# Patient Record
Sex: Female | Born: 1970 | Hispanic: No | Marital: Single
Health system: Southern US, Community
[De-identification: ages and names within clinical notes are randomized; demographics above are authoritative.]

---

## 2008-04-09 ENCOUNTER — Ambulatory Visit: Payer: Self-pay | Admitting: Family Medicine

## 2009-07-21 ENCOUNTER — Ambulatory Visit: Payer: Self-pay | Admitting: Physician Assistant

## 2009-09-20 ENCOUNTER — Ambulatory Visit: Payer: Self-pay | Admitting: Family Medicine

## 2011-11-29 ENCOUNTER — Other Ambulatory Visit: Payer: Self-pay | Admitting: Gastroenterology

## 2016-03-10 ENCOUNTER — Other Ambulatory Visit: Payer: Self-pay | Admitting: Family Medicine

## 2016-03-10 DIAGNOSIS — Z1231 Encounter for screening mammogram for malignant neoplasm of breast: Secondary | ICD-10-CM

## 2016-03-13 ENCOUNTER — Ambulatory Visit: Payer: Self-pay

## 2016-03-29 ENCOUNTER — Ambulatory Visit
Admission: RE | Admit: 2016-03-29 | Discharge: 2016-03-29 | Disposition: A | Discharge status: Home / Self Care | Payer: Commercial Managed Care - HMO | Source: Ambulatory Visit | Attending: Family Medicine | Admitting: Family Medicine

## 2016-03-29 DIAGNOSIS — Z1231 Encounter for screening mammogram for malignant neoplasm of breast: Secondary | ICD-10-CM

## 2016-05-02 ENCOUNTER — Encounter: Payer: Self-pay | Admitting: Sports Medicine

## 2016-05-02 ENCOUNTER — Ambulatory Visit (INDEPENDENT_AMBULATORY_CARE_PROVIDER_SITE_OTHER): Payer: Commercial Managed Care - HMO

## 2016-05-02 ENCOUNTER — Ambulatory Visit (INDEPENDENT_AMBULATORY_CARE_PROVIDER_SITE_OTHER): Payer: Commercial Managed Care - HMO | Admitting: Sports Medicine

## 2016-05-02 DIAGNOSIS — M79671 Pain in right foot: Secondary | ICD-10-CM | POA: Diagnosis not present

## 2016-05-02 DIAGNOSIS — M722 Plantar fascial fibromatosis: Secondary | ICD-10-CM

## 2016-05-02 MED ORDER — TRIAMCINOLONE ACETONIDE 10 MG/ML IJ SUSP: 10.0000 mg | Freq: Once | INTRAMUSCULAR | Status: AC

## 2016-05-02 NOTE — Patient Instructions (Signed)

## 2016-05-02 NOTE — Progress Notes (Signed)
Subjective: Vicki Reed is a 46 y.o. female patient presents to office with complaint of heel pain on the right. 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 Patient has treated this problem with frozen water bottle with no relief. Denies any other pedal complaints.   There are no active problems to display for this patient.   No current outpatient prescriptions on file prior to visit.   No current facility-administered medications on file prior to visit.     Not on File  Objective: Physical Exam General: The patient is alert and oriented x3 in no acute distress.  Dermatology: Skin is warm, dry and supple bilateral lower extremities. Nails 1-10 are normal. There is no erythema, edema, no eccymosis, no open lesions present. Integument is otherwise unremarkable.  Vascular: Dorsalis Pedis pulse and Posterior Tibial pulse are 2/4 bilateral. Capillary fill time is immediate to all digits.  Neurological: Grossly intact to light touch with an achilles reflex of +2/5 and a negative Tinel's sign bilateral.  Musculoskeletal: Tenderness to palpation at the medial calcaneal tubercale and through the insertion of the plantar fascia on the right foot. No pain with compression of calcaneus bilateral. No pain with tuning fork to calcaneus bilateral. No pain with calf compression bilateral. There is decreased Ankle joint range of motion bilateral. All other joints range of motion within normal limits bilateral. Strength 5/5 in all groups bilateral.   Gait: Unassisted, Antalgic avoid weight on left/right heel  Xray, Right foot:  Normal osseous mineralization. Joint spaces preserved except at midfoot with osteophytes. No fracture/dislocation/boney destruction. Calcaneal spur present with mild thickening of plantar fascia. No other soft tissue abnormalities or radiopaque foreign bodies.   Assessment and Plan: Problem List Items Addressed This Visit    None    Visit  Diagnoses    Right foot pain    -  Primary   Relevant Medications   triamcinolone acetonide (KENALOG) 10 MG/ML injection 10 mg (Start on 05/02/2016  9:00 AM)   Other Relevant Orders   DG Foot 2 Views Right   Plantar fasciitis, right       Relevant Medications   triamcinolone acetonide (KENALOG) 10 MG/ML injection 10 mg (Start on 05/02/2016  9:00 AM)     -Complete examination performed.  -Xrays reviewed -Discussed with patient in detail the condition of plantar fasciitis, how this occurs and general treatment options. Explained both conservative and surgical treatments.  -After oral consent and aseptic prep, injected a mixture containing 1 ml of 2% plain lidocaine, 1 ml 0.5% plain marcaine, 0.5 ml of kenalog 10 and 0.5 ml of dexamethasone phosphate into right heel. Post-injection care discussed with patient.  -Recommended good supportive shoes and advised use of OTC insert. Explained to patient that if these orthoses work well, we will continue with these. If these do not improve her condition and  pain, we will consider custom molded orthoses. - Explained in detail the use of the fascial brace for right foot which was dispensed at today's visit. -Explained and dispensed to patient daily stretching exercises. -Recommend patient to ice affected area 1-2x daily. -Patient to return to office in 4 weeks for follow up or sooner if problems or questions arise.  Asencion Islamitorya Sherod Cisse, DPM

## 2016-05-11 DIAGNOSIS — Z23 Encounter for immunization: Secondary | ICD-10-CM | POA: Diagnosis not present

## 2016-06-13 ENCOUNTER — Encounter: Payer: Self-pay | Admitting: Sports Medicine

## 2016-06-13 ENCOUNTER — Ambulatory Visit (INDEPENDENT_AMBULATORY_CARE_PROVIDER_SITE_OTHER): Payer: Commercial Managed Care - HMO | Admitting: Sports Medicine

## 2016-06-13 DIAGNOSIS — M79671 Pain in right foot: Secondary | ICD-10-CM | POA: Diagnosis not present

## 2016-06-13 DIAGNOSIS — M722 Plantar fascial fibromatosis: Secondary | ICD-10-CM

## 2016-06-13 MED ORDER — DICLOFENAC SODIUM 75 MG PO TBEC: 75.0000 mg | DELAYED_RELEASE_TABLET | Freq: Two times a day (BID) | ORAL | 0 refills | Status: AC

## 2016-06-13 NOTE — Patient Instructions (Signed)

## 2016-06-13 NOTE — Progress Notes (Signed)
Subjective: Vicki Reed is a 46 y.o. female returns to office for follow up evaluation after Right heel injection for plantar fasciitis, injection #1 administered 4 weeks ago. Patient states that the injection seems to help her pain; pain is now 1/10 and has decreased in frequency to the area. Patient denies any recent changes in medications or new problems since last visit.   There are no active problems to display for this patient.   Current Outpatient Prescriptions on File Prior to Visit  Medication Sig Dispense Refill  . NUVARING 0.12-0.015 MG/24HR vaginal ring      Current Facility-Administered Medications on File Prior to Visit  Medication Dose Route Frequency Provider Last Rate Last Dose  . triamcinolone acetonide (KENALOG) 10 MG/ML injection 10 mg  10 mg Other Once Asencion Islamitorya Burgess Sheriff, DPM        Not on File  Objective:   General:  Alert and oriented x 3, in no acute distress  Dermatology: Skin is warm, dry, and supple bilateral. Nails are within normal limits. There is no lower extremity erythema, no eccymosis, no open lesions present bilateral.   Vascular: Dorsalis Pedis and Posterior Tibial pedal pulses are 2/4 bilateral. + hair growth noted bilateral. Capillary Fill Time is 3 seconds in all digits. No varicosities, No edema bilateral lower extremities.   Neurological: Sensation grossly intact to light touch with an achilles reflex of +2 and a  negative Tinel's sign bilateral. Vibratory, sharp/dull, Semmes Weinstein Monofilament within normal limits.   Musculoskeletal: There is decreased tenderness to palpation at the medial calcaneal tubercale and through the insertion of the plantar fascia on the right foot. No pain with compression to calcaneus or application of tuning fork. There is decreased Ankle joint range of motion bilateral. All other jointsrange of motion  within normal limits bilateral. Strength 5/5 bilateral.   Assessment and Plan: Problem List Items  Addressed This Visit    None    Visit Diagnoses    Plantar fasciitis, right    -  Primary   Relevant Medications   diclofenac (VOLTAREN) 75 MG EC tablet   Right foot pain       Relevant Medications   diclofenac (VOLTAREN) 75 MG EC tablet      -Complete examination performed.  -Previous x-rays reviewed. -Discussed with patient in detail the condition of plantar fasciitis, how this occurs related to the foot type of the patient and general treatment options. - Continue with fascial brace; will wean next visit  -Rx Diclofenac after 2 weeks may return to running with increase in mileage slowly  -Continue with stretching, icing, good supportive shoes, inserts daily.  -Discussed long term care and reocurrence; will closely monitor; if fails to improve will consider other treatment modalities.  -Patient to return to office in 4-5 weeks for follow up or sooner if problems or questions arise.  Asencion Islamitorya Masao Junker, DPM

## 2016-06-21 DIAGNOSIS — S6981XD Other specified injuries of right wrist, hand and finger(s), subsequent encounter: Secondary | ICD-10-CM | POA: Diagnosis not present

## 2016-06-21 DIAGNOSIS — M25631 Stiffness of right wrist, not elsewhere classified: Secondary | ICD-10-CM | POA: Diagnosis not present

## 2016-06-21 DIAGNOSIS — M25531 Pain in right wrist: Secondary | ICD-10-CM | POA: Diagnosis not present

## 2016-07-11 ENCOUNTER — Ambulatory Visit: Payer: Commercial Managed Care - HMO | Admitting: Sports Medicine

## 2016-07-19 DIAGNOSIS — M25531 Pain in right wrist: Secondary | ICD-10-CM | POA: Diagnosis not present

## 2016-07-19 DIAGNOSIS — M25631 Stiffness of right wrist, not elsewhere classified: Secondary | ICD-10-CM | POA: Diagnosis not present

## 2016-07-19 DIAGNOSIS — S6981XD Other specified injuries of right wrist, hand and finger(s), subsequent encounter: Secondary | ICD-10-CM | POA: Diagnosis not present

## 2016-09-19 DIAGNOSIS — Z23 Encounter for immunization: Secondary | ICD-10-CM | POA: Diagnosis not present

## 2017-03-12 DIAGNOSIS — Z0000 Encounter for general adult medical examination without abnormal findings: Secondary | ICD-10-CM | POA: Diagnosis not present

## 2017-03-14 ENCOUNTER — Other Ambulatory Visit: Payer: Self-pay | Admitting: Family Medicine

## 2017-03-14 DIAGNOSIS — Z124 Encounter for screening for malignant neoplasm of cervix: Secondary | ICD-10-CM | POA: Diagnosis not present

## 2017-03-14 DIAGNOSIS — Z1231 Encounter for screening mammogram for malignant neoplasm of breast: Secondary | ICD-10-CM

## 2017-03-14 DIAGNOSIS — Z0000 Encounter for general adult medical examination without abnormal findings: Secondary | ICD-10-CM | POA: Diagnosis not present

## 2017-03-14 DIAGNOSIS — R21 Rash and other nonspecific skin eruption: Secondary | ICD-10-CM | POA: Diagnosis not present

## 2017-03-26 DIAGNOSIS — R21 Rash and other nonspecific skin eruption: Secondary | ICD-10-CM | POA: Diagnosis not present

## 2017-04-16 ENCOUNTER — Ambulatory Visit
Admission: RE | Admit: 2017-04-16 | Discharge: 2017-04-16 | Disposition: A | Discharge status: Home / Self Care | Payer: Commercial Managed Care - HMO | Source: Ambulatory Visit | Attending: Family Medicine | Admitting: Family Medicine

## 2017-04-16 DIAGNOSIS — Z1231 Encounter for screening mammogram for malignant neoplasm of breast: Secondary | ICD-10-CM | POA: Diagnosis not present

## 2017-05-09 ENCOUNTER — Encounter: Payer: Self-pay | Admitting: Family Medicine

## 2017-12-06 DIAGNOSIS — R1084 Generalized abdominal pain: Secondary | ICD-10-CM | POA: Diagnosis not present

## 2017-12-06 DIAGNOSIS — N912 Amenorrhea, unspecified: Secondary | ICD-10-CM | POA: Diagnosis not present

## 2017-12-06 DIAGNOSIS — R300 Dysuria: Secondary | ICD-10-CM | POA: Diagnosis not present

## 2018-02-19 DIAGNOSIS — M7711 Lateral epicondylitis, right elbow: Secondary | ICD-10-CM | POA: Diagnosis not present

## 2018-03-13 DIAGNOSIS — M9907 Segmental and somatic dysfunction of upper extremity: Secondary | ICD-10-CM | POA: Diagnosis not present

## 2018-03-13 DIAGNOSIS — M9902 Segmental and somatic dysfunction of thoracic region: Secondary | ICD-10-CM | POA: Diagnosis not present

## 2018-03-13 DIAGNOSIS — M9901 Segmental and somatic dysfunction of cervical region: Secondary | ICD-10-CM | POA: Diagnosis not present

## 2018-03-15 DIAGNOSIS — E559 Vitamin D deficiency, unspecified: Secondary | ICD-10-CM | POA: Diagnosis not present

## 2018-03-15 DIAGNOSIS — Z0000 Encounter for general adult medical examination without abnormal findings: Secondary | ICD-10-CM | POA: Diagnosis not present

## 2018-03-18 DIAGNOSIS — M7711 Lateral epicondylitis, right elbow: Secondary | ICD-10-CM | POA: Diagnosis not present

## 2018-03-18 DIAGNOSIS — Z0000 Encounter for general adult medical examination without abnormal findings: Secondary | ICD-10-CM | POA: Diagnosis not present

## 2018-03-18 DIAGNOSIS — Z6824 Body mass index (BMI) 24.0-24.9, adult: Secondary | ICD-10-CM | POA: Diagnosis not present

## 2018-03-19 DIAGNOSIS — M9902 Segmental and somatic dysfunction of thoracic region: Secondary | ICD-10-CM | POA: Diagnosis not present

## 2018-03-19 DIAGNOSIS — M9907 Segmental and somatic dysfunction of upper extremity: Secondary | ICD-10-CM | POA: Diagnosis not present

## 2018-03-19 DIAGNOSIS — M9901 Segmental and somatic dysfunction of cervical region: Secondary | ICD-10-CM | POA: Diagnosis not present

## 2018-04-02 DIAGNOSIS — M9901 Segmental and somatic dysfunction of cervical region: Secondary | ICD-10-CM | POA: Diagnosis not present

## 2018-04-02 DIAGNOSIS — M9902 Segmental and somatic dysfunction of thoracic region: Secondary | ICD-10-CM | POA: Diagnosis not present

## 2018-04-02 DIAGNOSIS — M9907 Segmental and somatic dysfunction of upper extremity: Secondary | ICD-10-CM | POA: Diagnosis not present

## 2018-04-05 DIAGNOSIS — M9907 Segmental and somatic dysfunction of upper extremity: Secondary | ICD-10-CM | POA: Diagnosis not present

## 2018-04-05 DIAGNOSIS — M9901 Segmental and somatic dysfunction of cervical region: Secondary | ICD-10-CM | POA: Diagnosis not present

## 2018-04-05 DIAGNOSIS — M9902 Segmental and somatic dysfunction of thoracic region: Secondary | ICD-10-CM | POA: Diagnosis not present

## 2018-04-10 DIAGNOSIS — M9901 Segmental and somatic dysfunction of cervical region: Secondary | ICD-10-CM | POA: Diagnosis not present

## 2018-04-10 DIAGNOSIS — M9907 Segmental and somatic dysfunction of upper extremity: Secondary | ICD-10-CM | POA: Diagnosis not present

## 2018-04-10 DIAGNOSIS — M9902 Segmental and somatic dysfunction of thoracic region: Secondary | ICD-10-CM | POA: Diagnosis not present

## 2018-04-15 DIAGNOSIS — M9901 Segmental and somatic dysfunction of cervical region: Secondary | ICD-10-CM | POA: Diagnosis not present

## 2018-04-15 DIAGNOSIS — M9907 Segmental and somatic dysfunction of upper extremity: Secondary | ICD-10-CM | POA: Diagnosis not present

## 2018-04-15 DIAGNOSIS — M9902 Segmental and somatic dysfunction of thoracic region: Secondary | ICD-10-CM | POA: Diagnosis not present

## 2018-04-20 DIAGNOSIS — M9902 Segmental and somatic dysfunction of thoracic region: Secondary | ICD-10-CM | POA: Diagnosis not present

## 2018-04-20 DIAGNOSIS — M9907 Segmental and somatic dysfunction of upper extremity: Secondary | ICD-10-CM | POA: Diagnosis not present

## 2018-04-20 DIAGNOSIS — M9901 Segmental and somatic dysfunction of cervical region: Secondary | ICD-10-CM | POA: Diagnosis not present

## 2018-05-17 DIAGNOSIS — Z01419 Encounter for gynecological examination (general) (routine) without abnormal findings: Secondary | ICD-10-CM | POA: Diagnosis not present

## 2018-05-17 DIAGNOSIS — Z1231 Encounter for screening mammogram for malignant neoplasm of breast: Secondary | ICD-10-CM | POA: Diagnosis not present

## 2018-05-20 DIAGNOSIS — H01119 Allergic dermatitis of unspecified eye, unspecified eyelid: Secondary | ICD-10-CM | POA: Diagnosis not present

## 2018-05-23 DIAGNOSIS — Z30431 Encounter for routine checking of intrauterine contraceptive device: Secondary | ICD-10-CM | POA: Diagnosis not present

## 2018-08-26 IMAGING — MG DIGITAL SCREENING BILATERAL MAMMOGRAM WITH CAD
4 series · 4 of 4 positions shown · non-contrast
Comparison: Previous exam(s).

CLINICAL DATA: Screening.

EXAM:
DIGITAL SCREENING BILATERAL MAMMOGRAM WITH CAD

[L CC]
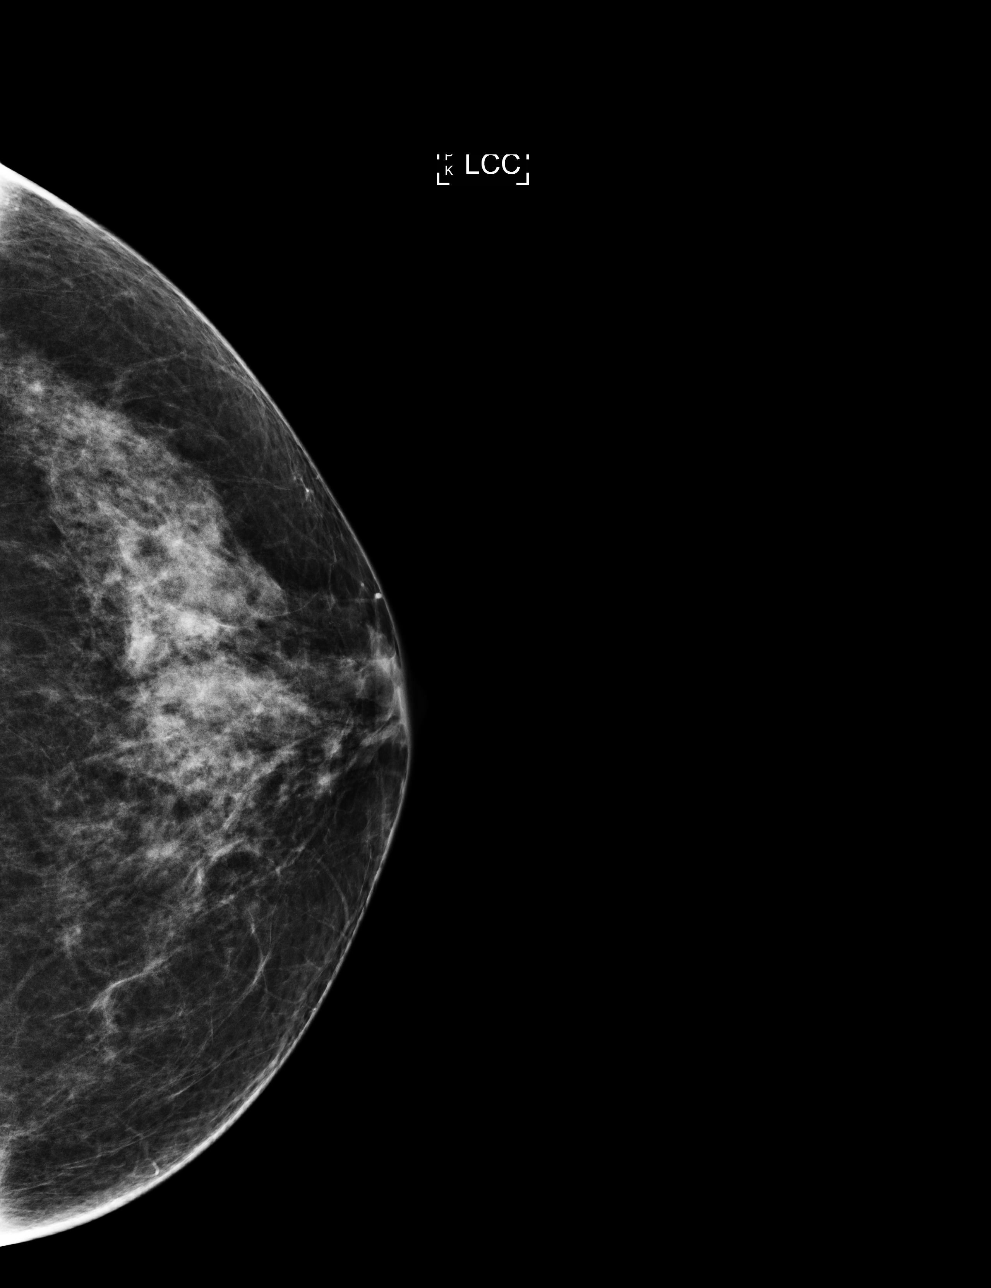

[L MLO]
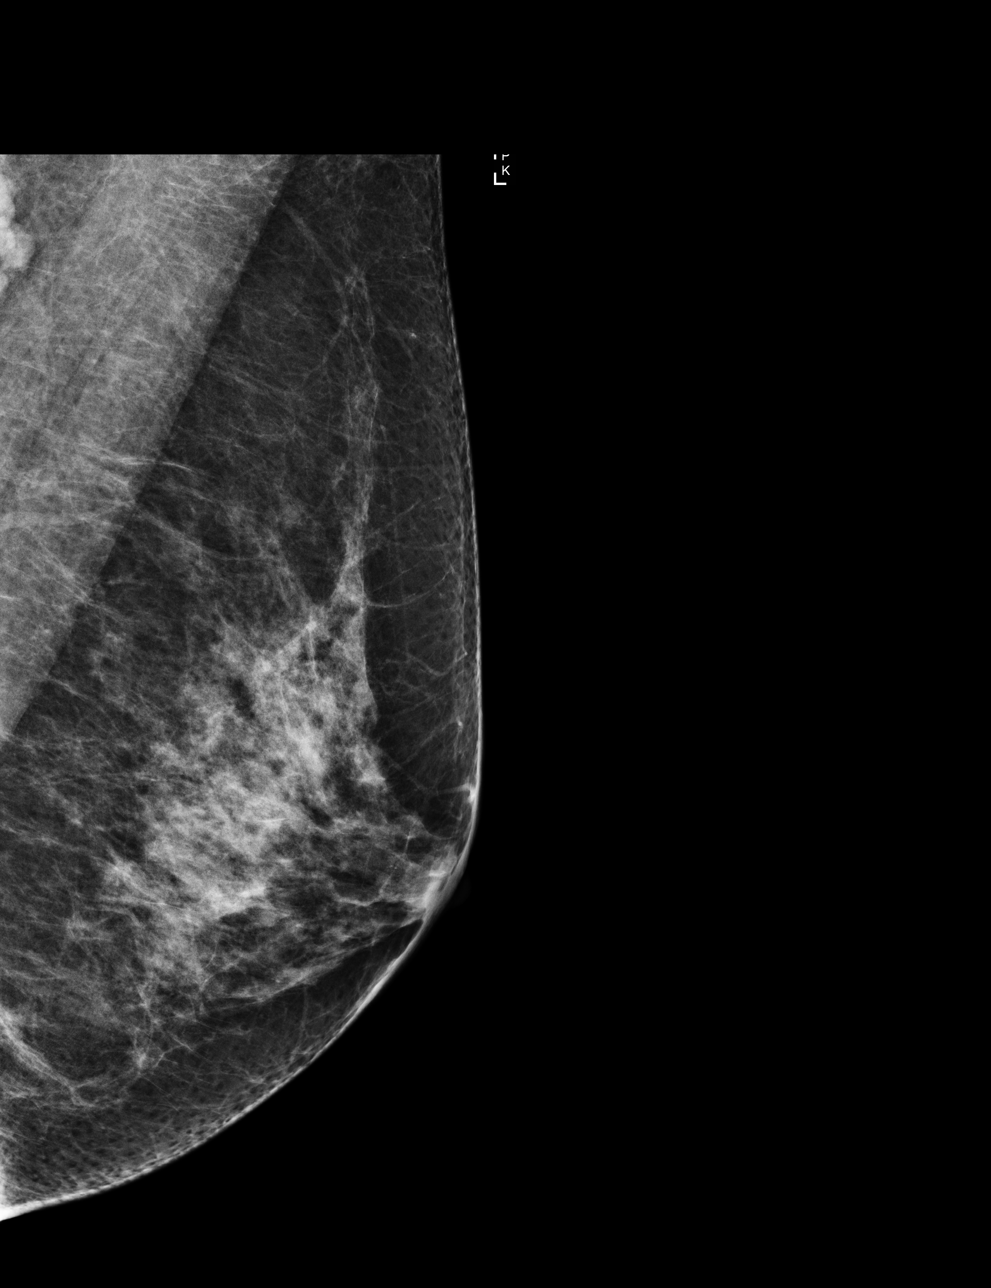

[R MLO]
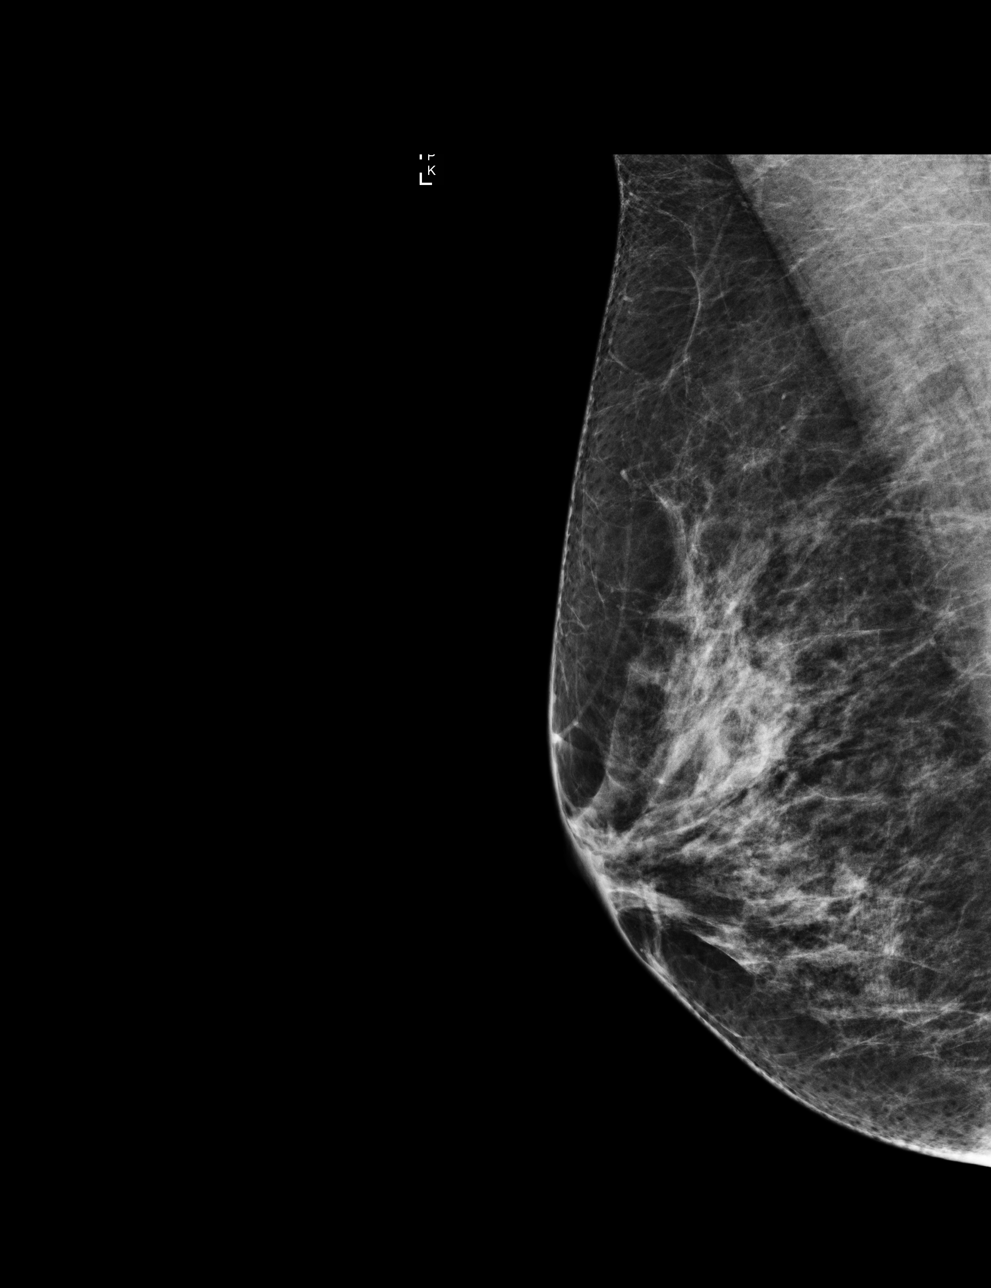

[R CC]
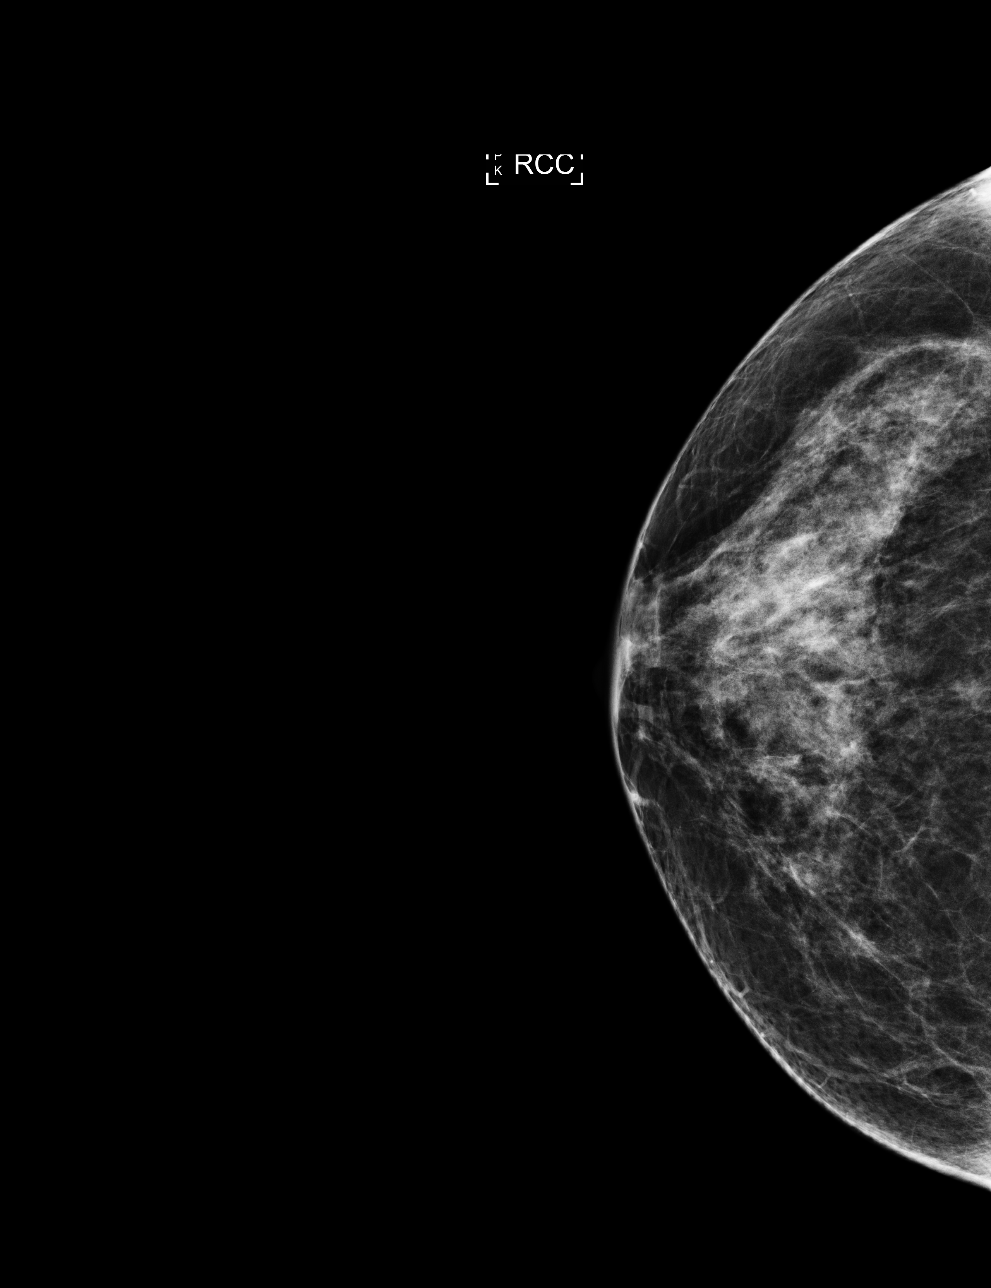

[4 of 4 positions shown; findings below may reference images not displayed]

ACR Breast Density Category c: The breast tissue is heterogeneously
dense, which may obscure small masses.
FINDINGS: There are no findings suspicious for malignancy. Images were
processed with CAD.
IMPRESSION: No mammographic evidence of malignancy. A result letter of this
screening mammogram will be mailed directly to the patient.

RECOMMENDATION:
Screening mammogram in one year. (Code:YJ-[id number])

BI-RADS CATEGORY  1: Negative.
